# Patient Record
Sex: Female | Born: 1955 | Race: Black or African American | Hispanic: No | Marital: Single | Smoking: Never smoker
Health system: Southern US, Community
[De-identification: ages and names within clinical notes are randomized; demographics above are authoritative.]

## PROBLEM LIST (undated history)

## (undated) DIAGNOSIS — Z83719 Family history of colon polyps, unspecified: Secondary | ICD-10-CM

## (undated) DIAGNOSIS — E559 Vitamin D deficiency, unspecified: Secondary | ICD-10-CM

## (undated) DIAGNOSIS — L259 Unspecified contact dermatitis, unspecified cause: Secondary | ICD-10-CM

## (undated) DIAGNOSIS — J302 Other seasonal allergic rhinitis: Secondary | ICD-10-CM

## (undated) DIAGNOSIS — I6529 Occlusion and stenosis of unspecified carotid artery: Secondary | ICD-10-CM

## (undated) DIAGNOSIS — J449 Chronic obstructive pulmonary disease, unspecified: Secondary | ICD-10-CM

## (undated) DIAGNOSIS — R059 Cough, unspecified: Secondary | ICD-10-CM

## (undated) DIAGNOSIS — R079 Chest pain, unspecified: Secondary | ICD-10-CM

## (undated) DIAGNOSIS — I6522 Occlusion and stenosis of left carotid artery: Secondary | ICD-10-CM

## (undated) DIAGNOSIS — R942 Abnormal results of pulmonary function studies: Secondary | ICD-10-CM

## (undated) DIAGNOSIS — E538 Deficiency of other specified B group vitamins: Secondary | ICD-10-CM

## (undated) DIAGNOSIS — I259 Chronic ischemic heart disease, unspecified: Secondary | ICD-10-CM

## (undated) DIAGNOSIS — K5730 Diverticulosis of large intestine without perforation or abscess without bleeding: Secondary | ICD-10-CM

## (undated) DIAGNOSIS — R319 Hematuria, unspecified: Secondary | ICD-10-CM

## (undated) DIAGNOSIS — R21 Rash and other nonspecific skin eruption: Secondary | ICD-10-CM

## (undated) DIAGNOSIS — L309 Dermatitis, unspecified: Secondary | ICD-10-CM

## (undated) DIAGNOSIS — I340 Nonrheumatic mitral (valve) insufficiency: Secondary | ICD-10-CM

## (undated) DIAGNOSIS — Z6829 Body mass index (BMI) 29.0-29.9, adult: Secondary | ICD-10-CM

## (undated) DIAGNOSIS — I739 Peripheral vascular disease, unspecified: Secondary | ICD-10-CM

## (undated) DIAGNOSIS — R0602 Shortness of breath: Secondary | ICD-10-CM

## (undated) DIAGNOSIS — K649 Unspecified hemorrhoids: Secondary | ICD-10-CM

## (undated) DIAGNOSIS — I700 Atherosclerosis of aorta: Secondary | ICD-10-CM

## (undated) DIAGNOSIS — L739 Follicular disorder, unspecified: Secondary | ICD-10-CM

## (undated) DIAGNOSIS — K635 Polyp of colon: Secondary | ICD-10-CM

## (undated) DIAGNOSIS — I5189 Other ill-defined heart diseases: Secondary | ICD-10-CM

## (undated) DIAGNOSIS — M545 Low back pain, unspecified: Secondary | ICD-10-CM

## (undated) DIAGNOSIS — R9431 Abnormal electrocardiogram [ECG] [EKG]: Secondary | ICD-10-CM

## (undated) DIAGNOSIS — R002 Palpitations: Secondary | ICD-10-CM

## (undated) DIAGNOSIS — K8020 Calculus of gallbladder without cholecystitis without obstruction: Secondary | ICD-10-CM

## (undated) DIAGNOSIS — R5383 Other fatigue: Secondary | ICD-10-CM

## (undated) DIAGNOSIS — M069 Rheumatoid arthritis, unspecified: Secondary | ICD-10-CM

## (undated) DIAGNOSIS — R7303 Prediabetes: Secondary | ICD-10-CM

## (undated) DIAGNOSIS — R238 Other skin changes: Secondary | ICD-10-CM

## (undated) DIAGNOSIS — I2510 Atherosclerotic heart disease of native coronary artery without angina pectoris: Secondary | ICD-10-CM

## (undated) DIAGNOSIS — M1990 Unspecified osteoarthritis, unspecified site: Secondary | ICD-10-CM

## (undated) DIAGNOSIS — I517 Cardiomegaly: Secondary | ICD-10-CM

## (undated) DIAGNOSIS — Z780 Asymptomatic menopausal state: Secondary | ICD-10-CM

## (undated) DIAGNOSIS — I509 Heart failure, unspecified: Secondary | ICD-10-CM

## (undated) DIAGNOSIS — E7800 Pure hypercholesterolemia, unspecified: Secondary | ICD-10-CM

## (undated) HISTORY — DX: Other fatigue: R53.83

## (undated) HISTORY — DX: Polyp of colon: K63.5

## (undated) HISTORY — DX: Nonrheumatic mitral (valve) insufficiency: I34.0

## (undated) HISTORY — DX: Abnormal results of pulmonary function studies: R94.2

## (undated) HISTORY — DX: Unspecified osteoarthritis, unspecified site: M19.90

## (undated) HISTORY — PX: ABDOMINAL HYSTERECTOMY: SHX81

## (undated) HISTORY — DX: Chest pain, unspecified: R07.9

## (undated) HISTORY — DX: Unspecified hemorrhoids: K64.9

## (undated) HISTORY — DX: Low back pain, unspecified: M54.50

## (undated) HISTORY — DX: Other seasonal allergic rhinitis: J30.2

## (undated) HISTORY — DX: Palpitations: R00.2

## (undated) HISTORY — DX: Occlusion and stenosis of left carotid artery: I65.22

## (undated) HISTORY — DX: Prediabetes: R73.03

## (undated) HISTORY — DX: Follicular disorder, unspecified: L73.9

## (undated) HISTORY — DX: Occlusion and stenosis of unspecified carotid artery: I65.29

## (undated) HISTORY — DX: Hematuria, unspecified: R31.9

## (undated) HISTORY — DX: Dermatitis, unspecified: L30.9

## (undated) HISTORY — DX: Peripheral vascular disease, unspecified: I73.9

## (undated) HISTORY — DX: Atherosclerosis of aorta: I70.0

## (undated) HISTORY — DX: Cardiomegaly: I51.7

## (undated) HISTORY — DX: Vitamin D deficiency, unspecified: E55.9

## (undated) HISTORY — DX: Body mass index (BMI) 29.0-29.9, adult: Z68.29

## (undated) HISTORY — DX: Family history of colon polyps, unspecified: Z83.719

## (undated) HISTORY — DX: Heart failure, unspecified: I50.9

## (undated) HISTORY — DX: Cough, unspecified: R05.9

## (undated) HISTORY — DX: Shortness of breath: R06.02

## (undated) HISTORY — DX: Other skin changes: R23.8

## (undated) HISTORY — DX: Rash and other nonspecific skin eruption: R21

## (undated) HISTORY — DX: Asymptomatic menopausal state: Z78.0

## (undated) HISTORY — DX: Pure hypercholesterolemia, unspecified: E78.00

## (undated) HISTORY — DX: Chronic obstructive pulmonary disease, unspecified: J44.9

## (undated) HISTORY — DX: Other ill-defined heart diseases: I51.89

## (undated) HISTORY — DX: Abnormal electrocardiogram (ECG) (EKG): R94.31

## (undated) HISTORY — DX: Calculus of gallbladder without cholecystitis without obstruction: K80.20

## (undated) HISTORY — DX: Chronic ischemic heart disease, unspecified: I25.9

## (undated) HISTORY — DX: Diverticulosis of large intestine without perforation or abscess without bleeding: K57.30

## (undated) HISTORY — DX: Deficiency of other specified B group vitamins: E53.8

## (undated) HISTORY — DX: Unspecified contact dermatitis, unspecified cause: L25.9

## (undated) HISTORY — DX: Atherosclerotic heart disease of native coronary artery without angina pectoris: I25.10

---

## 2010-10-11 ENCOUNTER — Emergency Department (HOSPITAL_BASED_OUTPATIENT_CLINIC_OR_DEPARTMENT_OTHER)
Admission: EM | Admit: 2010-10-11 | Discharge: 2010-10-11 | Payer: Self-pay | Source: Home / Self Care | Admitting: Emergency Medicine

## 2011-11-18 IMAGING — CR DG CHEST 2V
2 series · 2 of 2 positions shown · non-contrast
Comparison: None

CLINICAL DATA: Flu-like symptoms.  Cough.

CHEST - 2 VIEW

[w chest pa]
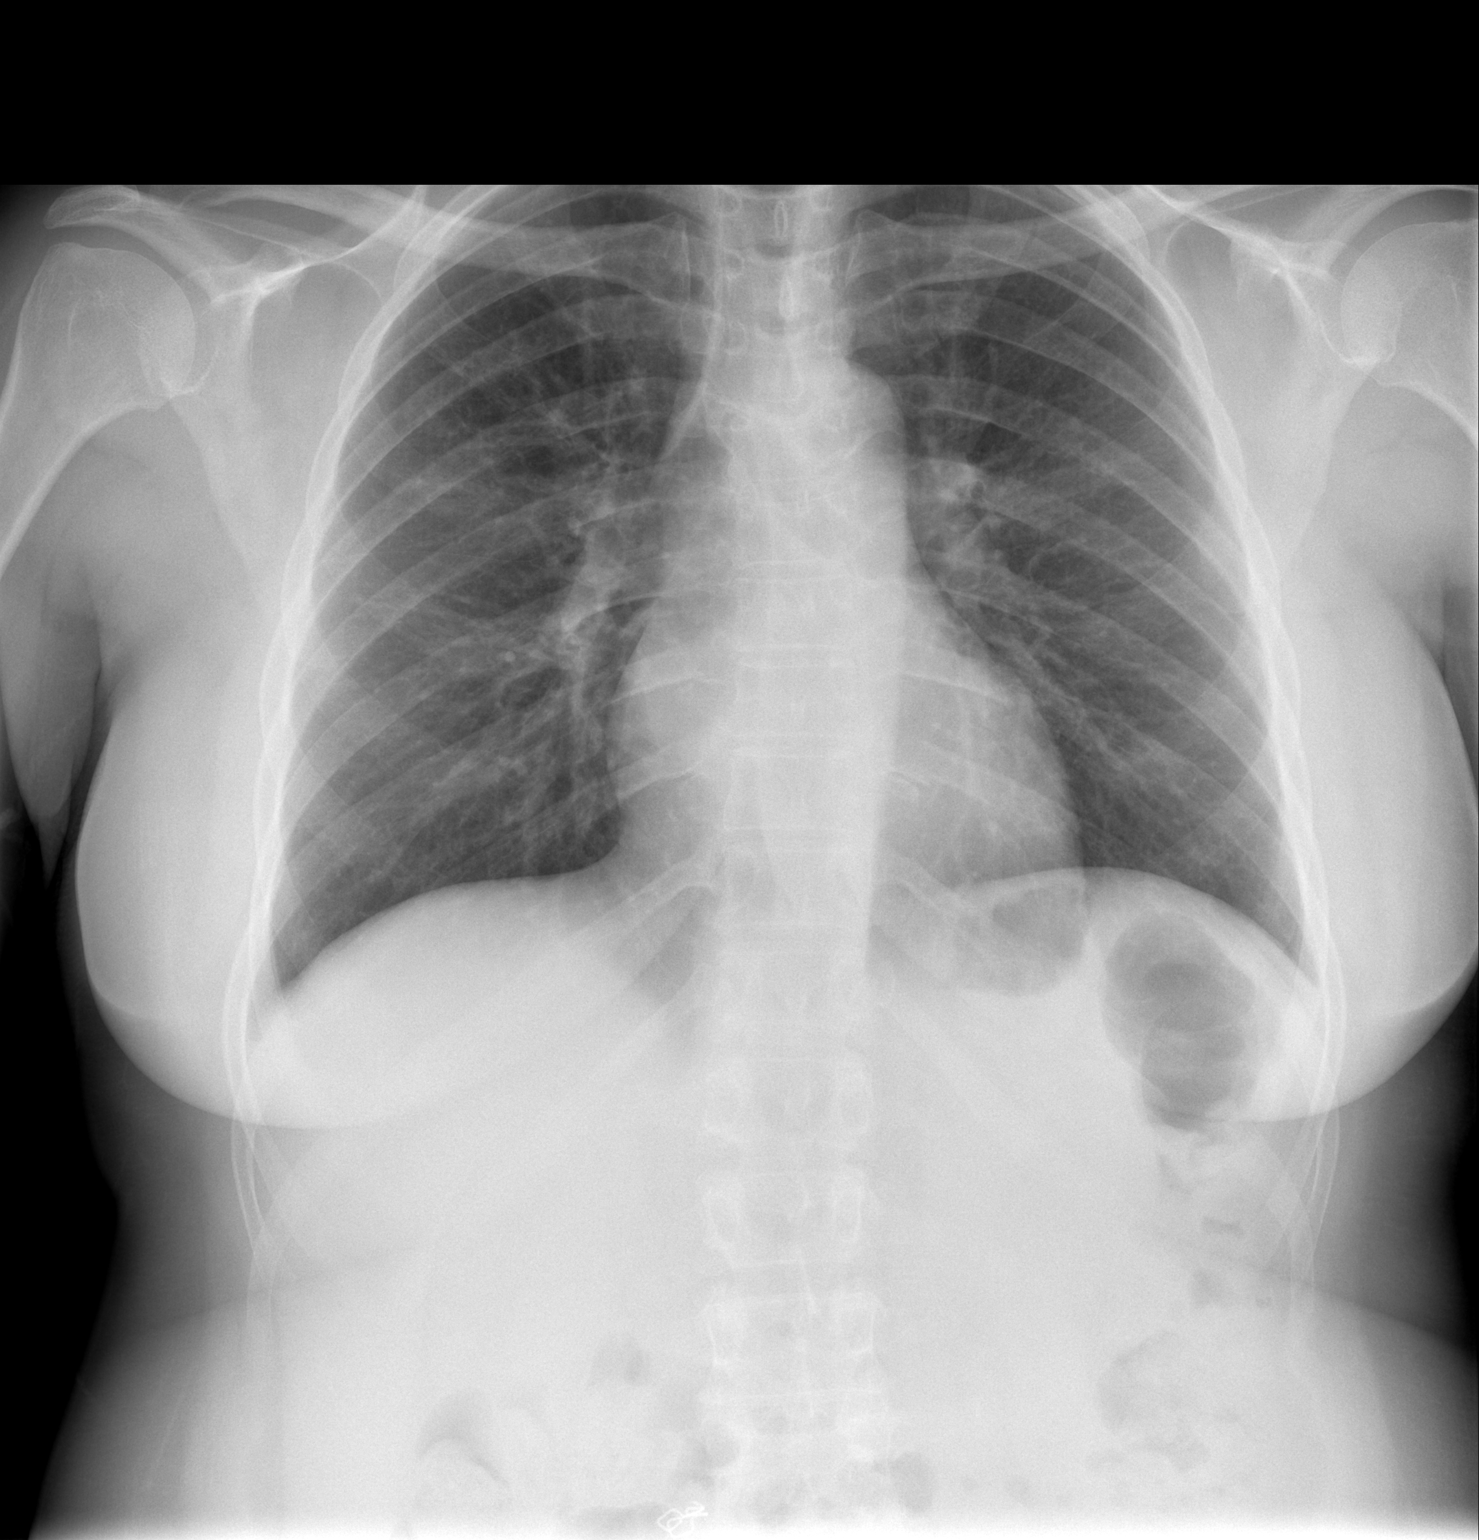

[w chest lat]
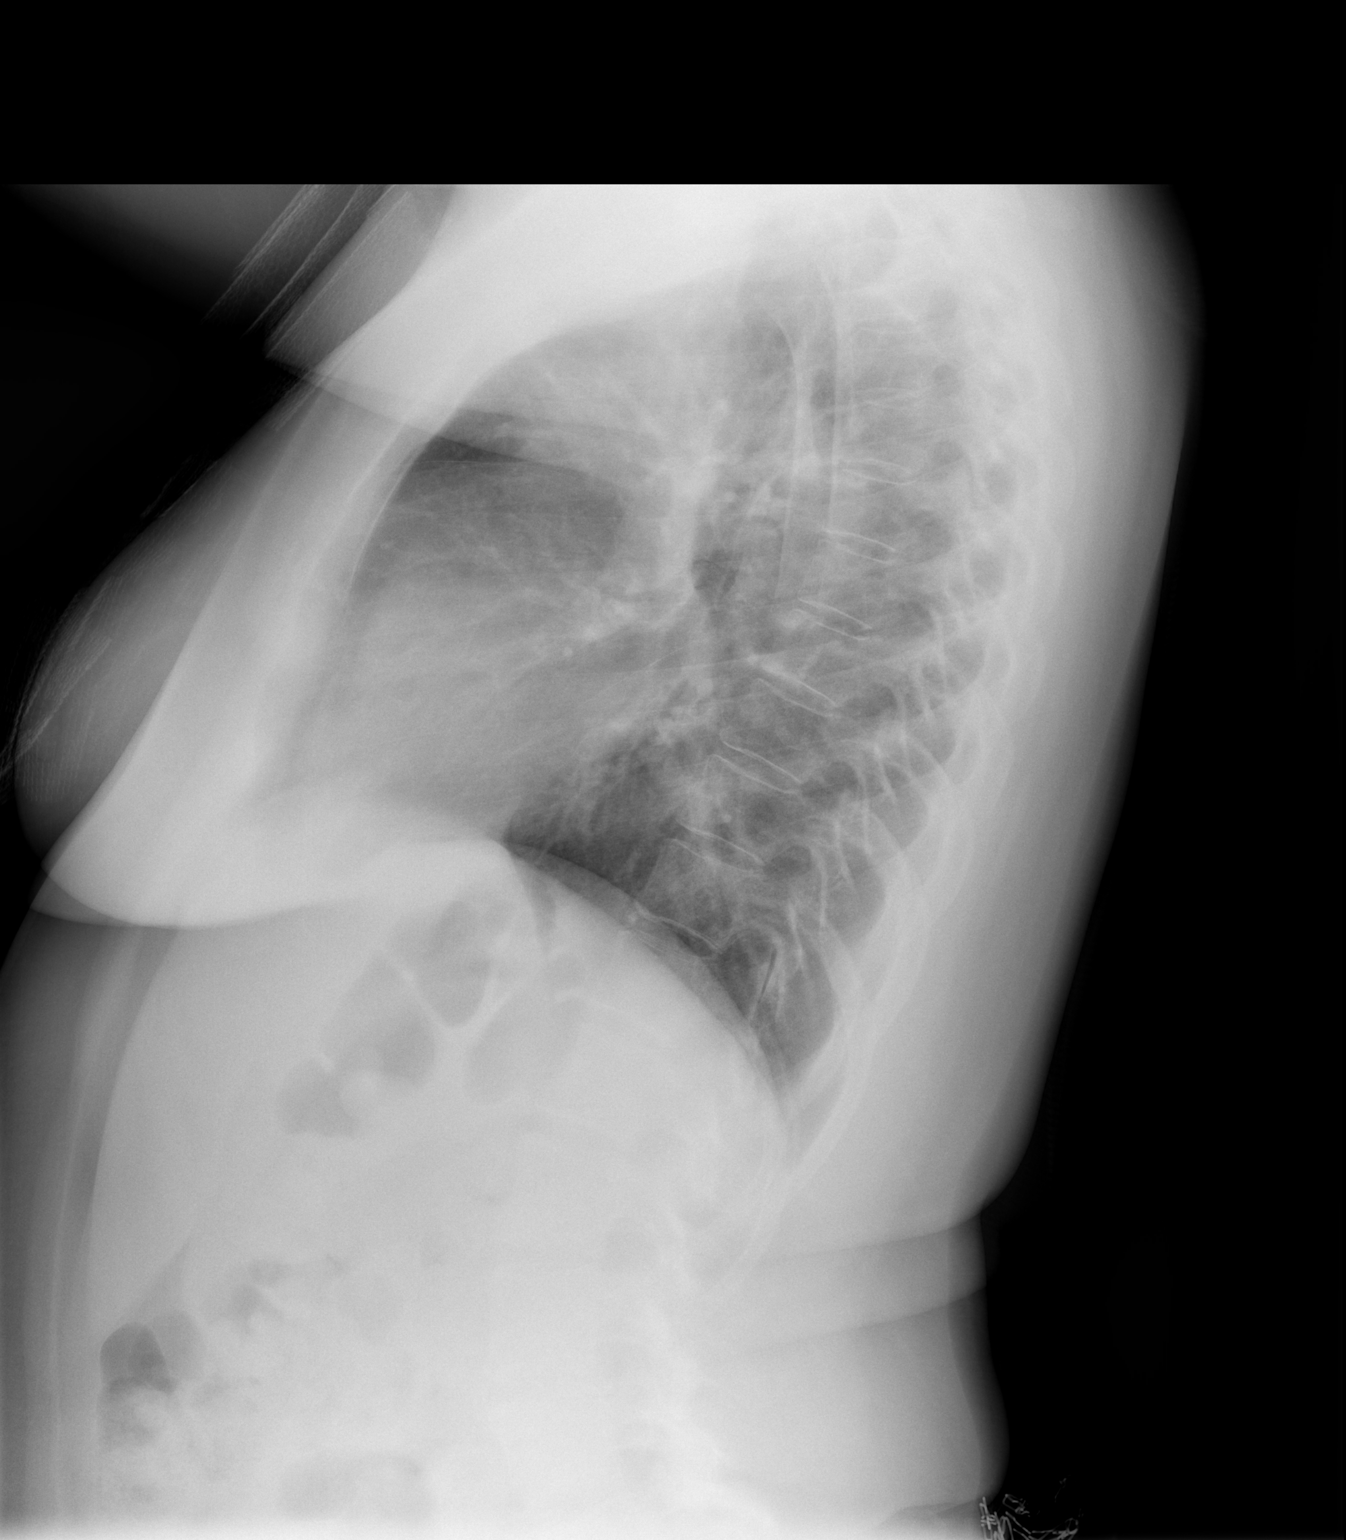

[2 of 2 positions shown; findings below may reference images not displayed]

FINDINGS: The heart size and mediastinal contours are within normal
limits.  Both lungs are clear.  The visualized skeletal structures
are unremarkable.
IMPRESSION: No active cardiopulmonary abnormalities.

## 2015-05-28 ENCOUNTER — Emergency Department (HOSPITAL_BASED_OUTPATIENT_CLINIC_OR_DEPARTMENT_OTHER)
Admission: EM | Admit: 2015-05-28 | Discharge: 2015-05-28 | Disposition: A | Discharge status: Home / Self Care | Payer: 59 | Attending: Emergency Medicine | Admitting: Emergency Medicine

## 2015-05-28 ENCOUNTER — Encounter (HOSPITAL_BASED_OUTPATIENT_CLINIC_OR_DEPARTMENT_OTHER): Payer: Self-pay | Admitting: *Deleted

## 2015-05-28 DIAGNOSIS — R21 Rash and other nonspecific skin eruption: Secondary | ICD-10-CM | POA: Diagnosis present

## 2015-05-28 DIAGNOSIS — L259 Unspecified contact dermatitis, unspecified cause: Secondary | ICD-10-CM | POA: Diagnosis not present

## 2015-05-28 DIAGNOSIS — Z8739 Personal history of other diseases of the musculoskeletal system and connective tissue: Secondary | ICD-10-CM | POA: Insufficient documentation

## 2015-05-28 HISTORY — DX: Rheumatoid arthritis, unspecified: M06.9

## 2015-05-28 MED ORDER — PREDNISONE 20 MG PO TABS: ORAL_TABLET | ORAL | Status: DC

## 2015-05-28 MED ORDER — HYDROXYZINE HCL 25 MG PO TABS: 25.0000 mg | ORAL_TABLET | Freq: Four times a day (QID) | ORAL | Status: DC

## 2015-05-28 NOTE — ED Notes (Signed)
Pt reports having her hair colored last week, pt concerned she may be having an allergic reaction to the hair dye as she has been experiencing scalp pruritis and rash behind ears and hairline - pt states she has attempted to use calamine lotion at home w/o relief.

## 2015-05-28 NOTE — Discharge Instructions (Signed)

## 2015-05-28 NOTE — ED Provider Notes (Signed)
CSN: [id number]   Arrival date & time 05/28/15 2139  History  This chart was scribed for Rolan Bucco, MD by Bethel Born, ED Scribe. This patient was seen in room MH01/MH01 and the patient's care was started at 10:01 PM.  Chief Complaint  Patient presents with  . Pruritis  . Allergic Reaction    HPI The history is provided by the patient. No language interpreter was used.   Cathy Estes is a 59 y.o. female who presents to the Emergency Department complaining of an allergic reaction to hair dye. Pt had her hair colored "almost a month ago" and now has itching at the scalp and a pruritic rash near the ears with onset 5 days ago. She has had a similar reaction with dye in the past and denies using any other new products. Calamine lotion provided insufficient relief in itching PTA. Pt denies SOB and angioedema.   Past Medical History  Diagnosis Date  . Rheumatoid arthritis     Past Surgical History  Procedure Laterality Date  . Abdominal hysterectomy      History reviewed. No pertinent family history.  Social History  Substance Use Topics  . Smoking status: Never Smoker   . Smokeless tobacco: None  . Alcohol Use: No     Review of Systems  Constitutional: Negative for fever, chills, diaphoresis and fatigue.  HENT: Negative for congestion, rhinorrhea and sneezing.   Eyes: Negative.   Respiratory: Negative for cough, chest tightness and shortness of breath.   Cardiovascular: Negative for chest pain and leg swelling.  Gastrointestinal: Negative for nausea, vomiting, abdominal pain, diarrhea and blood in stool.  Genitourinary: Negative for frequency, hematuria, flank pain and difficulty urinating.  Musculoskeletal: Negative for back pain and arthralgias.  Skin: Positive for rash.  Neurological: Negative for dizziness, speech difficulty, weakness, numbness and headaches.     Home Medications   Prior to Admission medications   Medication Sig Start Date End Date Taking?  Authorizing Provider  hydrOXYzine (ATARAX/VISTARIL) 25 MG tablet Take 1 tablet (25 mg total) by mouth every 6 (six) hours. 05/28/15   Rolan Bucco, MD  predniSONE (DELTASONE) 20 MG tablet 3 tabs po day one, then 2 po daily x 4 days 05/28/15   Rolan Bucco, MD    Allergies  Sulfa antibiotics  Triage Vitals: BP 126/80 mmHg  Pulse 72  Temp(Src) 98.2 F (36.8 C) (Oral)  Resp 14  Ht 5\' 1"  (1.549 m)  Wt 159 lb (72.122 kg)  BMI 30.06 kg/m2  SpO2 98%  Physical Exam  Constitutional: She is oriented to person, place, and time. She appears well-developed and well-nourished.  HENT:  Head: Normocephalic and atraumatic.  No facial swelling or angioedema  Eyes: Pupils are equal, round, and reactive to light.  Neck: Normal range of motion. Neck supple.  Cardiovascular: Normal rate, regular rhythm and normal heart sounds.   Pulmonary/Chest: Effort normal and breath sounds normal. No respiratory distress. She has no wheezes. She has no rales. She exhibits no tenderness.  Abdominal: Soft. Bowel sounds are normal. There is no tenderness. There is no rebound and no guarding.  Musculoskeletal: Normal range of motion. She exhibits no edema.  Lymphadenopathy:    She has no cervical adenopathy.  Neurological: She is alert and oriented to person, place, and time.  Skin: Skin is warm and dry. Rash noted.  Patient has a slightly raised erythematous rash along the hairline and the face. It is blanching, no petechiae or purpura, no vesicles.  Psychiatric: She has  a normal mood and affect.     ED Course  Procedures   DIAGNOSTIC STUDIES: Oxygen Saturation is 96% on RA, normal by my interpretation.    COORDINATION OF CARE: 10:05 PM Discussed treatment plan with pt at bedside and pt agreed to plan.  No results found for this or any previous visit. No results found.  Imaging Review No results found.  EKG Interpretation None      MDM   Final diagnoses:  Contact dermatitis   Patient was  started on a five-day course of prednisone and given a prescription for Atarax for itching. Return precautions were given.  I personally performed the services described in this documentation, which was scribed in my presence.  The recorded information has been reviewed and considered.    Rolan Bucco, MD 05/28/15 [phone]

## 2020-03-13 ENCOUNTER — Other Ambulatory Visit: Payer: Self-pay

## 2020-03-13 ENCOUNTER — Emergency Department (HOSPITAL_BASED_OUTPATIENT_CLINIC_OR_DEPARTMENT_OTHER)
Admission: EM | Admit: 2020-03-13 | Discharge: 2020-03-13 | Disposition: A | Discharge status: Home / Self Care | Payer: Managed Care, Other (non HMO) | Attending: Emergency Medicine | Admitting: Emergency Medicine

## 2020-03-13 ENCOUNTER — Encounter (HOSPITAL_BASED_OUTPATIENT_CLINIC_OR_DEPARTMENT_OTHER): Payer: Self-pay | Admitting: Emergency Medicine

## 2020-03-13 ENCOUNTER — Emergency Department (HOSPITAL_BASED_OUTPATIENT_CLINIC_OR_DEPARTMENT_OTHER): Payer: Managed Care, Other (non HMO)

## 2020-03-13 DIAGNOSIS — M25562 Pain in left knee: Secondary | ICD-10-CM | POA: Diagnosis present

## 2020-03-13 DIAGNOSIS — M069 Rheumatoid arthritis, unspecified: Secondary | ICD-10-CM | POA: Insufficient documentation

## 2020-03-13 DIAGNOSIS — Z79899 Other long term (current) drug therapy: Secondary | ICD-10-CM | POA: Diagnosis not present

## 2020-03-13 MED ORDER — DICLOFENAC SODIUM 1 % EX GEL: 2.0000 g | Freq: Four times a day (QID) | CUTANEOUS | 0 refills | Status: DC

## 2020-03-13 NOTE — ED Provider Notes (Signed)
[facility] EMERGENCY DEPARTMENT Provider Note   CSN: [id number] Arrival date & time: 03/13/20  1709     History Chief Complaint  Patient presents with  . Knee Pain    Cathy Estes is a 64 y.o. female.  Patient is a 64 year old female who presents with left knee pain.  She said she was at work about 1 month ago and had her foot planted and twisted the wrong way.  She has had some pain and swelling to her knee since that time.  She denies any other injuries.  No prior injuries to the knee.  No numbness or weakness to the leg.  She has been using Aleve with some improvement in symptoms.  Hurts to walk on it but she is able to walk without significant difficulty.        Past Medical History:  Diagnosis Date  . Rheumatoid arthritis ([place])     There are no problems to display for this patient.   Past Surgical History:  Procedure Laterality Date  . ABDOMINAL HYSTERECTOMY       OB History   No obstetric history on file.     No family history on file.  Social History   Tobacco Use  . Smoking status: Never Smoker  . Smokeless tobacco: Never Used  Substance Use Topics  . Alcohol use: No  . Drug use: No    Home Medications Prior to Admission medications   Medication Sig Start Date End Date Taking? Authorizing Provider  diclofenac Sodium (VOLTAREN) 1 % GEL Apply 2 g topically 4 (four) times daily. 03/13/20   Malvin Johns, MD  hydrOXYzine (ATARAX/VISTARIL) 25 MG tablet Take 1 tablet (25 mg total) by mouth every 6 (six) hours. 05/28/15   Malvin Johns, MD  predniSONE (DELTASONE) 20 MG tablet 3 tabs po day one, then 2 po daily x 4 days 05/28/15   Malvin Johns, MD    Allergies    Sulfa antibiotics  Review of Systems   Review of Systems  Constitutional: Negative for fever.  Gastrointestinal: Negative for nausea and vomiting.  Musculoskeletal: Positive for arthralgias and joint swelling. Negative for back pain and neck pain.  Skin: Negative for wound.    Neurological: Negative for weakness, numbness and headaches.    Physical Exam Updated Vital Signs BP 123/79 (BP Location: Right Arm)   Pulse 75   Temp 98 F (36.7 C) Comment: Rounded on Pt explained the Delay   Resp 18   Ht 5\' 1"  (1.549 m)   Wt 72.1 kg   SpO2 100%   BMI 30.04 kg/m   Physical Exam Constitutional:      Appearance: She is well-developed.  HENT:     Head: Normocephalic and atraumatic.  Cardiovascular:     Rate and Rhythm: Normal rate.  Pulmonary:     Effort: Pulmonary effort is normal.  Musculoskeletal:        General: Tenderness present.     Cervical back: Normal range of motion and neck supple.     Comments: Patient has tenderness to the medial aspect of the knee and the proximal tibia on the right side.  There is some mild swelling and small effusion noted.  She is able to do a straight leg raise.  Patellar and quadriceps tendon appears to be intact.  There is no ligament laxity on exam.  No pain to the hip or ankle.  She is neurovascularly intact distally.  Pulses are intact in the foot.  No warmth or  erythema noted.  No overlying wounds or rashes.  Skin:    General: Skin is warm and dry.  Neurological:     Mental Status: She is alert and oriented to person, place, and time.     ED Results / Procedures / Treatments   Labs (all labs ordered are listed, but only abnormal results are displayed) Labs Reviewed - No data to display  EKG None  Radiology DG Knee Complete 4 Views Left  Result Date: 03/13/2020 CLINICAL DATA:  Left knee pain status post trauma 1 month ago. EXAM: LEFT KNEE - COMPLETE 4+ VIEW COMPARISON:  None. FINDINGS: No evidence of fracture, dislocation, or joint effusion. No evidence of arthropathy or other focal bone abnormality. Soft tissues are unremarkable. IMPRESSION: Negative. Electronically Signed   By: Aram Candela M.D.   On: 03/13/2020 17:49    Procedures Procedures (including critical care time)  Medications Ordered in  ED Medications - No data to display  ED Course  I have reviewed the triage vital signs and the nursing notes.  Pertinent labs & imaging results that were available during my care of the patient were reviewed by me and considered in my medical decision making (see chart for details).    MDM Rules/Calculators/A&P                      Patient presents with pain in her left knee.  I do not feel any obvious ligament laxity.  There is no bony injury noted on x-ray.  She was advised in symptomatic care.  She was given a prescription for Voltaren cream.  She was advised on icing.  She was given a referral to follow-up with Dr. Jordan Likes with sports medicine. Final Clinical Impression(s) / ED Diagnoses Final diagnoses:  Acute pain of left knee    Rx / DC Orders ED Discharge Orders         Ordered    diclofenac Sodium (VOLTAREN) 1 % GEL  4 times daily     03/13/20 2043           Rolan Bucco, MD 03/13/20 2047

## 2020-03-13 NOTE — ED Triage Notes (Signed)
L knee pain and swelling x 1 month. She states she turned wrong at work.

## 2021-02-25 ENCOUNTER — Encounter: Payer: Self-pay | Admitting: Gastroenterology

## 2021-05-23 ENCOUNTER — Encounter: Payer: Managed Care, Other (non HMO) | Admitting: Gastroenterology

## 2022-08-14 NOTE — Progress Notes (Unsigned)
NEW PATIENT Date of Service/Encounter:  08/15/22 Referring provider: Windell Hummingbird, PA-C Primary care provider: Windell Hummingbird, PA-C  Subjective:  Cathy Estes is a 65 y.o. female with a PMHx of rheumatoid arthritis presenting today for evaluation of dermatitis. History obtained from: chart review and patient.   Rash: October 28th put on hair dye and started having itching and bumps on her skull, starting putting on sunscreen which didn't help She then started using topical hydrocortisone and antihistamines She did see a dermatologist over the summer due to breaking out in a rash on her face and was given hydrocortisone and sunblock.   She has been using the same hair dye for years and has never had this issue-semi-perm named Cathy Estes. Caused her neck to get bumps and scalp soreness.  She is still symptomatic.  Prior to this, she will randomly get red itchy bumps that occur on her elbows and last a few days before resolving. She will treat with vaseline to keep her skin cool and this helps with itching.  Her eyes sometimes burn and itch, but not watery.  She will hydrate herself and this sometimes helps with eye symptoms.  She is not sure if antihistamines help with itching.   Sulfa-causes a rash  Other allergy screening: Food allergy: no Asthma: no Medication allergy: no Hymenoptera allergy: no Eczema:no   Past Medical History: Past Medical History:  Diagnosis Date   Rheumatoid arthritis ([place])    Medication List:  Current Outpatient Medications  Medication Sig Dispense Refill   diclofenac Sodium (VOLTAREN) 1 % GEL Apply 2 g topically 4 (four) times daily. 2 g 0   Fluocinolone Acetonide Body (DERMA-SMOOTHE/FS BODY) 0.01 % OIL Apply 1 Application topically 2 (two) times daily as needed. 118 mL 3   hydrOXYzine (ATARAX/VISTARIL) 25 MG tablet Take 1 tablet (25 mg total) by mouth every 6 (six) hours. 12 tablet 0   predniSONE (DELTASONE) 20 MG tablet 3 tabs po day one, then  2 po daily x 4 days 11 tablet 0   triamcinolone ointment (KENALOG) 0.1 % Apply topically twice daily to BODY as needed for red, sandpaper like rash.  Do not use on face, groin or armpits. 80 g 1   No current facility-administered medications for this visit.   Known Allergies:  Allergies  Allergen Reactions   Sulfa Antibiotics Rash   Past Surgical History: Past Surgical History:  Procedure Laterality Date   ABDOMINAL HYSTERECTOMY     Family History: History reviewed. No pertinent family history. Social History: Cathy Estes lives in an apartment built 4 years ago, no water damage, carpet in bedroom, electric heating, central AC, no pets, no cockroaches, not using dust mite protection on the bedding or pillows, no smoke exposure.  She works in [employer] for 11 years, + HEPA filter in the home.  [place]   ROS:  All other systems negative except as noted per HPI.  Objective:  Blood pressure 112/76, pulse 76, temperature 98.4 F (36.9 C), temperature source Temporal, resp. rate 18, height 5\' 1"  (1.549 m), weight 153 lb 6.4 oz (69.6 kg), SpO2 100 %. Body mass index is 28.98 kg/m. Physical Exam:  General Appearance:  Alert, cooperative, no distress, appears stated age  Head:  Normocephalic, without obvious abnormality, atraumatic  Eyes:  Conjunctiva clear, EOM's intact  Nose: Nares normal, hypertrophic turbinates, normal mucosa, no visible anterior polyps, and septum midline  Throat: Lips, tongue normal; teeth and gums normal, normal posterior oropharynx  Neck:  Supple, symmetrical  Lungs:   clear to auscultation bilaterally, Respirations unlabored, no coughing  Heart:  regular rate and rhythm and no murmur, Appears well perfused  Extremities: No edema  Skin: Skin color, texture, turgor normal, excoriation on scalp without obvious rash, hyperpigmented macule behind ear  Neurologic: No gross deficits     Diagnostics: Skin Testing:  Environmental allergy panel and select foods.  Adequate controls. Results discussed with patient/family.  Airborne Adult Perc - 08/15/22 1037     Time Antigen Placed 1037    Allergen Manufacturer Waynette Buttery    Location Back    Number of Test 59    1. Control-Buffer 50% Glycerol Negative    2. Control-Histamine 1 mg/ml 3+    3. Albumin saline Negative    4. Bahia Negative    5. French Southern Territories Negative    6. Johnson Negative    7. Kentucky Blue Negative    8. Meadow Fescue Negative    9. Perennial Rye Negative    10. Sweet Vernal Negative    11. Timothy Negative    12. Cocklebur Negative    13. Burweed Marshelder Negative    14. Ragweed, short Negative    15. Ragweed, Giant Negative    16. Plantain,  English Negative    17. Lamb's Quarters Negative    18. Sheep Sorrell Negative    19. Rough Pigweed Negative    20. Marsh Elder, Rough Negative    21. Mugwort, Common Negative    22. Ash mix Negative    23. Birch mix Negative    24. Beech American Negative    25. Box, Elder Negative    26. Cedar, red Negative    27. Cottonwood, Guinea-Bissau Negative    28. Elm mix Negative    29. Hickory Negative    30. Maple mix Negative    31. Oak, Guinea-Bissau mix Negative    32. Pecan Pollen Negative    33. Pine mix Negative    34. Sycamore Eastern Negative    35. Walnut, Black Pollen Negative    36. Alternaria alternata Negative    37. Cladosporium Herbarum Negative    38. Aspergillus mix Negative    39. Penicillium mix Negative    40. Bipolaris sorokiniana (Helminthosporium) Negative    41. Drechslera spicifera (Curvularia) Negative    42. Mucor plumbeus Negative    43. Fusarium moniliforme Negative    44. Aureobasidium pullulans (pullulara) Negative    45. Rhizopus oryzae Negative    46. Botrytis cinera Negative    47. Epicoccum nigrum Negative    48. Phoma betae Negative    49. Candida Albicans Negative    50. Trichophyton mentagrophytes Negative    51. Mite, D Farinae  5,000 AU/ml Negative     52. Mite, D Pteronyssinus  5,000 AU/ml Negative    53. Cat Hair 10,000 BAU/ml Negative    54.  Dog Epithelia Negative    55. Mixed Feathers Negative    56. Horse Epithelia Negative    57. Cockroach, German Negative    58. Mouse Negative    59. Tobacco Leaf Negative             Food Perc - 08/15/22 1036       Test Information   Time Antigen Placed 1036    Allergen Manufacturer Waynette Buttery    Location Back    Number of allergen test 10      Food   1. Peanut Negative    2. Soybean  food Negative    3. Wheat, whole Negative    4. Sesame Negative    5. Milk, cow Negative    6. Egg White, chicken Negative    7. Casein Negative    8. Shellfish mix Negative    9. Fish mix Negative    10. Cashew Negative             Intradermal - 08/15/22 1036     Time Antigen Placed 1036    Allergen Manufacturer Other    Location Arm    Number of Test 15    Control Negative    French Southern Territories Negative    Johnson Negative    7 Grass Negative    Ragweed mix Negative    Weed mix Negative    Tree mix Negative    Mold 1 Negative    Mold 2 Negative    Mold 3 Negative    Mold 4 Negative    Cat Negative    Dog Negative    Cockroach Negative    Mite mix Negative            Allergy testing results were read and interpreted by myself, documented by clinical staff.  Assessment and Plan  Scalp Dermatitis-suspect secondary to contact dermatitis - environmental and food allergy testing to most common food allergens negative today - return for patch testing  -Monday, Wednesday, Friday (must not receive any steroid injections or increased prendisone doses for 4 weeks prior to testing)  - can use dermasoothe up to twice daily on scalp as needed for rash if returns - for elbows, use triamcinolone 0.01% up to twice daily as needed for rash if returns until rash resolves - for itching or runny nose/congestion, use Zyrtec 10 mg or Xyzal 5 mg daily as needed.  -for watery/itchy eyes-consider pataday or  zaditor over the counter eye drops as needed  Avoid sulfas.  Follow up : for patch testing as scheduled  This note in its entirety was forwarded to the Provider who requested this consultation.  Thank you for your kind referral. I appreciate the opportunity to take part in Susanne's care. Please do not hesitate to contact me with questions.  Sincerely,  Tonny Bollman, MD [facility]

## 2022-08-15 ENCOUNTER — Encounter: Payer: Self-pay | Admitting: Internal Medicine

## 2022-08-15 ENCOUNTER — Ambulatory Visit (INDEPENDENT_AMBULATORY_CARE_PROVIDER_SITE_OTHER): Payer: Commercial Managed Care - HMO | Admitting: Internal Medicine

## 2022-08-15 VITALS — BP 112/76 | HR 76 | Temp 98.4°F | Resp 18 | Ht 61.0 in | Wt 153.4 lb

## 2022-08-15 DIAGNOSIS — J310 Chronic rhinitis: Secondary | ICD-10-CM

## 2022-08-15 DIAGNOSIS — L2082 Flexural eczema: Secondary | ICD-10-CM

## 2022-08-15 DIAGNOSIS — R21 Rash and other nonspecific skin eruption: Secondary | ICD-10-CM | POA: Diagnosis not present

## 2022-08-15 DIAGNOSIS — Z882 Allergy status to sulfonamides status: Secondary | ICD-10-CM

## 2022-08-15 DIAGNOSIS — L235 Allergic contact dermatitis due to other chemical products: Secondary | ICD-10-CM | POA: Diagnosis not present

## 2022-08-15 MED ORDER — TRIAMCINOLONE ACETONIDE 0.1 % EX OINT: TOPICAL_OINTMENT | CUTANEOUS | 1 refills | Status: DC

## 2022-08-15 MED ORDER — FLUOCINOLONE ACETONIDE BODY 0.01 % EX OIL: 1.0000 | TOPICAL_OIL | Freq: Two times a day (BID) | CUTANEOUS | 3 refills | Status: DC | PRN

## 2022-08-15 NOTE — Patient Instructions (Addendum)
Scalp Dermatitis-suspect secondary to contact dermatitis - environmental and food allergy testing to most common food allergens negative today - return for patch testing  -Monday, Wednesday, Friday (must not receive any steroid injections or increased prendisone doses for 4 weeks prior to testing)  - can use dermasoothe up to twice daily on scalp as needed for rash if returns - for elbows, use triamcinolone 0.01% up to twice daily as needed for rash if returns until rash resolves - for itching or runny nose/congestion, use Zyrtec 10 mg or Xyzal 5 mg daily as needed.  -for watery/itchy eyes-consider pataday or zaditor over the counter eye drops as needed  Avoid sulfas.  Follow up : for patch testing as scheduled It was a pleasure meeting you in clinic today! Thank you for allowing me to participate in your care.  Sigurd Sos, MD [facility]

## 2022-09-11 ENCOUNTER — Ambulatory Visit: Payer: Commercial Managed Care - HMO | Admitting: Internal Medicine

## 2022-09-13 ENCOUNTER — Encounter: Payer: Commercial Managed Care - HMO | Admitting: Internal Medicine

## 2022-09-15 ENCOUNTER — Encounter: Payer: Commercial Managed Care - HMO | Admitting: Family

## 2023-02-01 ENCOUNTER — Emergency Department (HOSPITAL_BASED_OUTPATIENT_CLINIC_OR_DEPARTMENT_OTHER)
Admission: EM | Admit: 2023-02-01 | Discharge: 2023-02-01 | Disposition: A | Discharge status: Home / Self Care | Payer: Medicaid Other | Attending: Emergency Medicine | Admitting: Emergency Medicine

## 2023-02-01 ENCOUNTER — Encounter (HOSPITAL_BASED_OUTPATIENT_CLINIC_OR_DEPARTMENT_OTHER): Payer: Self-pay

## 2023-02-01 ENCOUNTER — Other Ambulatory Visit: Payer: Self-pay

## 2023-02-01 DIAGNOSIS — R21 Rash and other nonspecific skin eruption: Secondary | ICD-10-CM | POA: Diagnosis present

## 2023-02-01 DIAGNOSIS — L234 Allergic contact dermatitis due to dyes: Secondary | ICD-10-CM | POA: Insufficient documentation

## 2023-02-01 MED ORDER — METHYLPREDNISOLONE 4 MG PO TBPK: ORAL_TABLET | ORAL | 0 refills | Status: DC

## 2023-02-01 MED ORDER — HYDROXYZINE HCL 25 MG PO TABS: 25.0000 mg | ORAL_TABLET | Freq: Four times a day (QID) | ORAL | 0 refills | Status: DC | PRN

## 2023-02-01 NOTE — ED Notes (Signed)
Pt has swollen areas and tiny bumps on face, hairline and on neck.  Pt does c/o itching

## 2023-02-01 NOTE — ED Provider Notes (Signed)
[facility] Provider Note: Lowella Dell, MD, FACEP  CSN: [id number] MRN: [id number] ARRIVAL: 02/01/23 at 0002 ROOM: MH09/MH09   CHIEF COMPLAINT  Allergic Reaction   HISTORY OF PRESENT ILLNESS  02/01/23 1:20 AM Cathy Estes is a 67 y.o. female who dyed her hair 3 days ago (it was a henna based dye).  Now she has a pruritic rash of her scalp.  She thinks the dye may have triggered an allergic reaction.  Symptoms are moderate.   Past Medical History:  Diagnosis Date   Rheumatoid arthritis     Past Surgical History:  Procedure Laterality Date   ABDOMINAL HYSTERECTOMY      History reviewed. No pertinent family history.  Social History   Tobacco Use   Smoking status: Never   Smokeless tobacco: Never  Vaping Use   Vaping Use: Never used  Substance Use Topics   Alcohol use: No   Drug use: No    Prior to Admission medications   Medication Sig Start Date End Date Taking? Authorizing Provider  diclofenac Sodium (VOLTAREN) 1 % GEL Apply 2 g topically 4 (four) times daily. 03/13/20   Rolan Bucco, MD  Fluocinolone Acetonide Body (DERMA-SMOOTHE/FS BODY) 0.01 % OIL Apply 1 Application topically 2 (two) times daily as needed. 08/15/22   Verlee Monte, MD  hydrOXYzine (ATARAX) 25 MG tablet Take 1-2 tablets (25-50 mg total) by mouth every 6 (six) hours as needed for itching (May cause drowsiness). 02/01/23  Yes Zaiah Credeur, MD  methylPREDNISolone (MEDROL DOSEPAK) 4 MG TBPK tablet Take tapering dose per package instructions. 02/01/23  Yes Nasif Bos, MD  triamcinolone ointment (KENALOG) 0.1 % Apply topically twice daily to BODY as needed for red, sandpaper like rash.  Do not use on face, groin or armpits. 08/15/22   Verlee Monte, MD    Allergies Sulfa antibiotics   REVIEW OF SYSTEMS  Negative except as noted here or in the History of Present Illness.   PHYSICAL EXAMINATION  Initial Vital Signs Blood pressure 129/79, pulse 72, temperature 98.3 F (36.8 C),  temperature source Oral, resp. rate 18, height  (1.549 m), weight 70.3 kg, SpO2 100 %.  Examination General: Well-developed, well-nourished female in no acute distress; appearance consistent with age of record HENT: normocephalic; atraumatic; scalp dermatitis Eyes: Normal appearance Neck: supple Heart: regular rate and rhythm Lungs: clear to auscultation bilaterally Abdomen: soft; nondistended; nontender; bowel sounds present Extremities: No deformity; full range of motion Neurologic: Awake, alert and oriented; motor function intact in all extremities and symmetric; no facial droop Skin: Warm and dry Psychiatric: Normal mood and affect   RESULTS  Summary of this visit's results, reviewed and interpreted by myself:   EKG Interpretation  Date/Time:    Ventricular Rate:    PR Interval:    QRS Duration:   QT Interval:    QTC Calculation:   R Axis:     Text Interpretation:         Laboratory Studies: No results found for this or any previous visit (from the past 24 hour(s)). Imaging Studies: No results found.  ED COURSE and MDM  Nursing notes, initial and subsequent vitals signs, including pulse oximetry, reviewed and interpreted by myself.  Vitals:   02/01/23 0010 02/01/23 0011  BP:  129/79  Pulse:  72  Resp:  18  Temp:  98.3 F (36.8 C)  TempSrc:  Oral  SpO2:  100%  Weight: 70.3 kg   Height:  (1.549 m)  Medications - No data to display  The patient would prefer a systemic steroid over a topical steroid.  She is already on low-dose prednisone (5 mg daily) for rheumatoid arthritis.  PROCEDURES  Procedures   ED DIAGNOSES     ICD-10-CM   1. 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

## 2023-02-01 NOTE — ED Triage Notes (Signed)
Pt c/o possible allergic reaction to hair color. Pt reports she colored her hair on Monday, now she has rash on bumps and head that itch.

## 2023-04-20 ENCOUNTER — Encounter (HOSPITAL_BASED_OUTPATIENT_CLINIC_OR_DEPARTMENT_OTHER): Payer: Self-pay

## 2023-04-20 ENCOUNTER — Emergency Department (HOSPITAL_BASED_OUTPATIENT_CLINIC_OR_DEPARTMENT_OTHER)
Admission: EM | Admit: 2023-04-20 | Discharge: 2023-04-20 | Disposition: A | Discharge status: Home / Self Care | Payer: Medicare PPO | Attending: Emergency Medicine | Admitting: Emergency Medicine

## 2023-04-20 ENCOUNTER — Emergency Department (HOSPITAL_BASED_OUTPATIENT_CLINIC_OR_DEPARTMENT_OTHER): Payer: Medicare PPO

## 2023-04-20 ENCOUNTER — Other Ambulatory Visit: Payer: Self-pay

## 2023-04-20 DIAGNOSIS — R1032 Left lower quadrant pain: Secondary | ICD-10-CM | POA: Insufficient documentation

## 2023-04-20 DIAGNOSIS — R051 Acute cough: Secondary | ICD-10-CM | POA: Insufficient documentation

## 2023-04-20 DIAGNOSIS — Z1152 Encounter for screening for COVID-19: Secondary | ICD-10-CM | POA: Insufficient documentation

## 2023-04-20 LAB — COMPREHENSIVE METABOLIC PANEL
ALT: 14 U/L (ref 0–44)
AST: 21 U/L (ref 15–41)
Albumin: 3.4 g/dL — ABNORMAL LOW (ref 3.5–5.0)
Alkaline Phosphatase: 43 U/L (ref 38–126)
Anion gap: 6 (ref 5–15)
BUN: 13 mg/dL (ref 8–23)
CO2: 27 mmol/L (ref 22–32)
Calcium: 9.4 mg/dL (ref 8.9–10.3)
Chloride: 103 mmol/L (ref 98–111)
Creatinine, Ser: 0.8 mg/dL (ref 0.44–1.00)
GFR, Estimated: >60 mL/min (ref >60)
Glucose, Bld: 93 mg/dL (ref 70–99)
Potassium: 3.8 mmol/L (ref 3.5–5.1)
Sodium: 136 mmol/L (ref 135–145)
Total Bilirubin: 0.9 mg/dL (ref 0.3–1.2)
Total Protein: 8.9 g/dL — ABNORMAL HIGH (ref 6.5–8.1)

## 2023-04-20 LAB — URINALYSIS, ROUTINE W REFLEX MICROSCOPIC
Bilirubin Urine: NEGATIVE
Glucose, UA: NEGATIVE mg/dL
Hgb urine dipstick: NEGATIVE
Ketones, ur: NEGATIVE mg/dL
Nitrite: NEGATIVE
Protein, ur: NEGATIVE mg/dL
Specific Gravity, Urine: 1.025 (ref 1.005–1.030)
pH: 5.5 (ref 5.0–8.0)

## 2023-04-20 LAB — URINALYSIS, MICROSCOPIC (REFLEX): RBC / HPF: NONE SEEN RBC/hpf (ref 0–5)

## 2023-04-20 LAB — CBC
HCT: 39.5 % (ref 36.0–46.0)
Hemoglobin: 12.8 g/dL (ref 12.0–15.0)
MCH: 29 pg (ref 26.0–34.0)
MCHC: 32.4 g/dL (ref 30.0–36.0)
MCV: 89.4 fL (ref 80.0–100.0)
Platelets: 304 10*3/uL (ref 150–400)
RBC: 4.42 MIL/uL (ref 3.87–5.11)
RDW: 14.1 % (ref 11.5–15.5)
WBC: 4.8 10*3/uL (ref 4.0–10.5)
nRBC: 0 % (ref 0.0–0.2)

## 2023-04-20 LAB — SARS CORONAVIRUS 2 BY RT PCR: SARS Coronavirus 2 by RT PCR: NEGATIVE

## 2023-04-20 LAB — LIPASE, BLOOD: Lipase: 35 U/L (ref 11–51)

## 2023-04-20 MED ORDER — BENZONATATE 100 MG PO CAPS: 100.0000 mg | ORAL_CAPSULE | Freq: Three times a day (TID) | ORAL | 0 refills | Status: DC

## 2023-04-20 MED ORDER — IOHEXOL 300 MG/ML  SOLN
100.0000 mL | Freq: Once | INTRAMUSCULAR | Status: AC | PRN
  Administered 2023-04-20: 100 mL via INTRAVENOUS

## 2023-04-20 MED ORDER — BENZONATATE 100 MG PO CAPS
100.0000 mg | ORAL_CAPSULE | Freq: Once | ORAL | Status: AC
  Administered 2023-04-20: 100 mg via ORAL
  Filled 2023-04-20: qty 1

## 2023-04-20 NOTE — ED Triage Notes (Signed)
Patient has been coughing and dx with bronchitis. She has been using an inhaler which she stated is helping. She stated now when she coughs her ABD Pain.

## 2023-04-20 NOTE — ED Provider Notes (Signed)
EMERGENCY DEPARTMENT AT [facility] Provider Note   CSN: [id number] Arrival date & time: 04/20/23  1437     History  Chief Complaint  Patient presents with   Abdominal Pain   Cough    Cathy Estes is a 67 y.o. female with a past medical history significant for RA who presents to the ED due to left lower quadrant abdominal pain that started a few days ago.  Patient states she was recently diagnosed with bronchitis 1 week ago.  She noticed her left lower quadrant abdominal pain typically gets worse when coughing.  Denies nausea, vomiting, and diarrhea.  No urinary or vaginal symptoms.  Patient has not been sexually active in numerous years.  No concern for STIs.  History of abdominal hysterectomy.  No other abdominal operations.  No fever or chills.  Notes cough has been persistent over the past week.  She has been using her albuterol inhaler with no relief.  Denies chest pain and shortness of breath.  History obtained from patient and past medical records. No interpreter used during encounter.       Home Medications Prior to Admission medications   Medication Sig Start Date End Date Taking? Authorizing Provider  benzonatate (TESSALON) 100 MG capsule Take 1 capsule (100 mg total) by mouth every 8 (eight) hours. 04/20/23  Yes Joice Nazario C, PA-C  diclofenac Sodium (VOLTAREN) 1 % GEL Apply 2 g topically 4 (four) times daily. 03/13/20   Rolan Bucco, MD  Fluocinolone Acetonide Body (DERMA-SMOOTHE/FS BODY) 0.01 % OIL Apply 1 Application topically 2 (two) times daily as needed. 08/15/22   Verlee Monte, MD  hydrOXYzine (ATARAX) 25 MG tablet Take 1-2 tablets (25-50 mg total) by mouth every 6 (six) hours as needed for itching (May cause drowsiness). 02/01/23   Molpus, John, MD  methylPREDNISolone (MEDROL DOSEPAK) 4 MG TBPK tablet Take tapering dose per package instructions. 02/01/23   Molpus, John, MD  triamcinolone ointment (KENALOG) 0.1 % Apply topically twice daily  to BODY as needed for red, sandpaper like rash.  Do not use on face, groin or armpits. 08/15/22   Verlee Monte, MD      Allergies    Sulfa antibiotics    Review of Systems   Review of Systems  Constitutional:  Negative for chills and fever.  Respiratory:  Positive for cough. Negative for shortness of breath.   Cardiovascular:  Negative for chest pain.  Gastrointestinal:  Positive for abdominal pain. Negative for diarrhea, nausea and vomiting.    Physical Exam Updated Vital Signs BP 135/77   Pulse 73   Temp 98.7 F (37.1 C) (Oral)   Resp (!) 26   Ht 5\' 1"  (1.549 m)   Wt 66.2 kg   SpO2 98%   BMI 27.59 kg/m  Physical Exam Vitals and nursing note reviewed.  Constitutional:      General: She is not in acute distress.    Appearance: She is not ill-appearing.  HENT:     Head: Normocephalic.  Eyes:     Pupils: Pupils are equal, round, and reactive to light.  Cardiovascular:     Rate and Rhythm: Normal rate and regular rhythm.     Pulses: Normal pulses.     Heart sounds: Normal heart sounds. No murmur heard.    No friction rub. No gallop.  Pulmonary:     Effort: Pulmonary effort is normal.     Breath sounds: Normal breath sounds.  Abdominal:     General:  Abdomen is flat. There is no distension.     Palpations: Abdomen is soft.     Tenderness: There is abdominal tenderness. There is no guarding or rebound.     Comments: LLQ tenderness  Musculoskeletal:        General: Normal range of motion.     Cervical back: Neck supple.  Skin:    General: Skin is warm and dry.  Neurological:     General: No focal deficit present.     Mental Status: She is alert.  Psychiatric:        Mood and Affect: Mood normal.        Behavior: Behavior normal.     ED Results / Procedures / Treatments   Labs (all labs ordered are listed, but only abnormal results are displayed) Labs Reviewed  COMPREHENSIVE METABOLIC PANEL - Abnormal; Notable for the following components:      Result Value    Total Protein 8.9 (*)    Albumin 3.4 (*)    All other components within normal limits  URINALYSIS, ROUTINE W REFLEX MICROSCOPIC - Abnormal; Notable for the following components:   Leukocytes,Ua SMALL (*)    All other components within normal limits  URINALYSIS, MICROSCOPIC (REFLEX) - Abnormal; Notable for the following components:   Bacteria, UA MANY (*)    All other components within normal limits  SARS CORONAVIRUS 2 BY RT PCR  URINE CULTURE  LIPASE, BLOOD  CBC    EKG EKG Interpretation Date/Time:  Friday April 20 2023 16:22:05 EDT Ventricular Rate:  69 PR Interval:  153 QRS Duration:  73 QT Interval:  380 QTC Calculation: 408 R Axis:   7  Text Interpretation: Sinus rhythm Anterior infarct, old No previous ECGs available Confirmed by Elayne Snare ([id number]) on 04/20/2023 5:34:39 PM  Radiology No results found.  Procedures Procedures    Medications Ordered in ED Medications  iohexol (OMNIPAQUE) 300 MG/ML solution 100 mL (100 mLs Intravenous Contrast Given 04/20/23 1813)    ED Course/ Medical Decision Making/ A&P                             Medical Decision Making Amount and/or Complexity of Data Reviewed Labs: ordered. Decision-making details documented in ED Course. Radiology: ordered and independent interpretation performed. Decision-making details documented in ED Course. ECG/medicine tests: ordered and independent interpretation performed. Decision-making details documented in ED Course.  Risk Prescription drug management.   This patient presents to the ED for concern of LLQ abdominal pain, this involves an extensive number of treatment options, and is a complaint that carries with it a high risk of complications and morbidity.  The differential diagnosis includes diverticulitis, appendicitis, bowel obstruction, perforation, pelvic etiology, etc  67 year old female presents to the ED due to left lower quadrant abdominal pain that has been present for the  past few days.  Recently diagnosed with bronchitis 1 week ago.  Pain typically worse when coughing.  No nausea, vomiting, or diarrhea.  No urinary or vaginal symptoms.  History of abdominal hysterectomy however, no other abdominal operations.  Upon arrival, vitals all within normal limits.  Patient in no acute distress.  Physical exam significant for tenderness in left lower quadrant.  Voluntary guarding.  No rebound.  Routine labs ordered.  CT abdomen to rule out evidence of diverticulitis or other etiologies of pain.  Chest x-ray and COVID test due to persistent cough however, likely postviral cough.  CBC reassuring.  No leukocytosis.  Normal hemoglobin.  Lipase normal.  Low suspicion for pancreatitis.  COVID-negative.  CMP significant for elevated total protein 8.9.  Normal renal function.  No major electrolyte derangements.  UA significant for many bacteria and small leukocytes.  6-10 WBC. Appears contaminated. Patient denies any dysuria. Urine culture pending. EKG NSR. No signs of acute ischemia  Patient handed off to Arabella Merles, PA-C at shift change pending CT abdomen and CXR. If unremarkable, patient may be discharged home.         Final Clinical Impression(s) / ED Diagnoses Final diagnoses:  Left lower quadrant abdominal pain  Acute cough    Rx / DC Orders ED Discharge Orders          Ordered    benzonatate (TESSALON) 100 MG capsule  Every 8 hours        04/20/23 1829              Mannie Stabile, PA-C 04/20/23 1858    Rexford Maus, DO 04/20/23 2347

## 2023-04-20 NOTE — ED Provider Notes (Addendum)
  Physical Exam  BP 135/77   Pulse 71   Temp 98.7 F (37.1 C) (Oral)   Resp (!) 27   Ht 5\' 1"  (1.549 m)   Wt 66.2 kg   SpO2 99%   BMI 27.59 kg/m   Physical Exam Vitals and nursing note reviewed.  Constitutional:      General: She is not in acute distress. Pulmonary:     Effort: Pulmonary effort is normal.     Comments: Dry cough Abdominal:     General: Abdomen is flat.     Tenderness: There is abdominal tenderness in the left lower quadrant.  Neurological:     Mental Status: She is alert.     Procedures  Procedures  ED Course / MDM    Medical Decision Making Amount and/or Complexity of Data Reviewed Labs: ordered. Radiology: ordered.  Risk Prescription drug management.   68 y.o. female presents to the ED for concern of coughing and left lower quadrant abdominal pain  Differential diagnosis includes but is not limited to URI, pneumonia, bronchitis, diverticulitis, ovarian cyst, acute cystitis  ED Course:  Patient handed off to me by Claudette Stapler, PA-C.  Patient well-appearing on recheck with dry cough that has been ongoing for over 1 week.  Vital signs remained stable.  No concern for pneumonia, pleural effusion on chest x-ray.  No concerning findings on CT abdomen pelvis that would explain her left lower quadrant abdominal pain.  COVID test negative here today.  Her urinalysis did show small amount of leukocytes, however no nitrites, she is not having any dysuria.  Do not suspect acute cystitis at this time. EKG with normal sinus rhythm. Suspect the abdominal pain is secondary to her cough.  She has been prescribed Tessalon pearls to help with her cough. Since her pharmacy is currently closed, she has been given 1 dose of Tessalon here in the ER before discharge.   Impression: Dry cough  Disposition:  The patient was discharged home with instructions to take [facility] as needed for cough.  She was instructed to follow-up with her primary care provider  if her cough is not improved within the next week.   Return precautions given.  Lab Tests: I Ordered, and personally interpreted labs.  The pertinent results include:   CBC with no leukocytosis CMP, lipase with no concerning findings COVID-negative Urinalysis with small leukocytes but lots of squamous epithelial cells, suspect not a clean catch  Imaging Studies ordered: I ordered imaging studies including CT abdomen pelvis I independently visualized the imaging with scope of interpretation limited to determining acute life threatening conditions related to emergency care.  CT abdomen pelvis with no acute findings Chest x-ray with no consolidations or pleural effusions I agree with the radiologist interpretation   Cardiac Monitoring: / EKG: The patient was maintained on a cardiac monitor.  I personally viewed and interpreted the cardiac monitored which showed an underlying rhythm of:  NSR   Consultations Obtained: None   Co morbidities that complicate the patient evaluation  None  Social Determinants of Health:  Unknown             Arabella Merles, PA-C 04/20/23 2106    Arabella Merles, PA-C 04/20/23 2107    Rexford Maus, DO 04/20/23 2347

## 2023-04-20 NOTE — Discharge Instructions (Addendum)
You have been seen in the ER today for your cough and abdominal pain.  Your chest x-ray did not show any signs of pneumonia.  Your CT scan did not show any concerning findings today.  We suspect your abdominal pain is due to your coughing.  Your COVID test was negative today.  You have been prescribed Tessalon (benzonatate) you may take 1 capsule by mouth every 8 hours as needed for cough.  Follow-up with your PCP if your cough has not started to improve within the next week.  Return to the ER if you develop fever or chills, worsening abdominal pain, shortness of breath, or any other concerning symptoms for you.

## 2023-04-22 LAB — URINE CULTURE: Culture: <10000 — AB

## 2024-06-17 ENCOUNTER — Other Ambulatory Visit (HOSPITAL_COMMUNITY): Payer: Self-pay | Admitting: Family Medicine

## 2024-06-17 ENCOUNTER — Encounter (HOSPITAL_COMMUNITY): Payer: Self-pay

## 2024-06-17 DIAGNOSIS — R079 Chest pain, unspecified: Secondary | ICD-10-CM

## 2024-06-25 ENCOUNTER — Ambulatory Visit (HOSPITAL_COMMUNITY)

## 2024-07-01 ENCOUNTER — Telehealth (HOSPITAL_COMMUNITY): Payer: Self-pay | Admitting: *Deleted

## 2024-07-01 NOTE — Telephone Encounter (Signed)
 Reaching out to patient to offer assistance regarding upcoming cardiac imaging study; pt verbalizes understanding of appt date/time, parking situation and where to check in, pre-test NPO status and medications ordered, and verified current allergies; name and call back number provided for further questions should they arise Sid Seats RN Navigator Cardiac Imaging [facility] [phone] office [phone] cell

## 2024-07-02 ENCOUNTER — Other Ambulatory Visit: Payer: Self-pay | Admitting: Cardiology

## 2024-07-02 ENCOUNTER — Ambulatory Visit (HOSPITAL_BASED_OUTPATIENT_CLINIC_OR_DEPARTMENT_OTHER)
Admission: RE | Admit: 2024-07-02 | Discharge: 2024-07-02 | Disposition: A | Discharge status: Home / Self Care | Source: Ambulatory Visit | Attending: Cardiology | Admitting: Cardiology

## 2024-07-02 ENCOUNTER — Ambulatory Visit (HOSPITAL_COMMUNITY)
Admission: RE | Admit: 2024-07-02 | Discharge: 2024-07-02 | Disposition: A | Discharge status: Home / Self Care | Source: Ambulatory Visit | Attending: Family Medicine | Admitting: Family Medicine

## 2024-07-02 DIAGNOSIS — I2510 Atherosclerotic heart disease of native coronary artery without angina pectoris: Secondary | ICD-10-CM

## 2024-07-02 DIAGNOSIS — R931 Abnormal findings on diagnostic imaging of heart and coronary circulation: Secondary | ICD-10-CM

## 2024-07-02 DIAGNOSIS — I700 Atherosclerosis of aorta: Secondary | ICD-10-CM | POA: Diagnosis not present

## 2024-07-02 DIAGNOSIS — R079 Chest pain, unspecified: Secondary | ICD-10-CM

## 2024-07-02 MED ORDER — IOHEXOL 350 MG/ML SOLN
100.0000 mL | Freq: Once | INTRAVENOUS | Status: AC | PRN
  Administered 2024-07-02: 100 mL via INTRAVENOUS

## 2024-07-02 MED ORDER — NITROGLYCERIN 0.4 MG SL SUBL
0.8000 mg | SUBLINGUAL_TABLET | Freq: Once | SUBLINGUAL | Status: AC
  Administered 2024-07-02: 0.8 mg via SUBLINGUAL

## 2024-07-02 NOTE — Telephone Encounter (Signed)
 Notified of critical CT results by Dr. Michele.   Results sent to Dr. Rufino office and Dr. Licia made aware with acknowledgement.     Attempted to call patient x2 to advise if she has any symptoms, please proceed to ER per MD, unable to reach, LMTCB.

## 2024-07-02 NOTE — Telephone Encounter (Signed)
 Patient returned call-aware if she has symptoms please proceed to ER per MD.  Aware to follow up with Dr. Licia.

## 2024-07-04 ENCOUNTER — Ambulatory Visit: Payer: Self-pay | Admitting: Cardiology

## 2024-07-14 ENCOUNTER — Ambulatory Visit: Attending: Cardiovascular Disease | Admitting: Cardiovascular Disease

## 2024-07-14 VITALS — BP 122/74 | HR 94 | Ht 61.0 in | Wt 152.4 lb

## 2024-07-14 DIAGNOSIS — I2510 Atherosclerotic heart disease of native coronary artery without angina pectoris: Secondary | ICD-10-CM | POA: Insufficient documentation

## 2024-07-14 DIAGNOSIS — R931 Abnormal findings on diagnostic imaging of heart and coronary circulation: Secondary | ICD-10-CM

## 2024-07-14 DIAGNOSIS — E782 Mixed hyperlipidemia: Secondary | ICD-10-CM | POA: Diagnosis not present

## 2024-07-14 DIAGNOSIS — E785 Hyperlipidemia, unspecified: Secondary | ICD-10-CM | POA: Insufficient documentation

## 2024-07-14 NOTE — Patient Instructions (Signed)
 Medication Instructions:  Your physician recommends that you continue on your current medications as directed. Please refer to the Current Medication list given to you today.  *If you need a refill on your cardiac medications before your next appointment, please call your pharmacy*  Lab Work: Your physician recommends that you have labs drawn today: BMET & CBC  If you have labs (blood work) drawn today and your tests are completely normal, you will receive your results only by: MyChart Message (if you have MyChart) OR A paper copy in the mail If you have any lab test that is abnormal or we need to change your treatment, we will call you to review the results.  Testing/Procedures: See below  Follow-Up: At [facility], you and your health needs are our priority.  As part of our continuing mission to provide you with exceptional heart care, our providers are all part of one team.  This team includes your primary Cardiologist (physician) and Advanced Practice Providers or APPs (Physician Assistants and Nurse Practitioners) who all work together to provide you with the care you need, when you need it.  Your next appointment:   2-3 week(s) after heart cath (10/14)  Provider:   Dorn Lesches, MD    We recommend signing up for the patient portal called MyChart.  Sign up information is provided on this After Visit Summary.  MyChart is used to connect with patients for Virtual Visits (Telemedicine).  Patients are able to view lab/test results, encounter notes, upcoming appointments, etc.  Non-urgent messages can be sent to your provider as well.   To learn more about what you can do with MyChart, go to [email or web address].   Other Instructions       Cardiac/Peripheral Catheterization   You are scheduled for a Cardiac Catheterization on Tuesday, October 14 with Dr. Alm Clay.  1. Please arrive at the [facility] (Main Entrance A) at [facility]: [address] [place], [place] [address] at 8:30 AM (This time is 2 hour(s) before your procedure to ensure your preparation).   Free valet parking service is available. You will check in at ADMITTING. The support person will be asked to wait in the waiting room.  It is OK to have someone drop you off and come back when you are ready to be discharged.        Special note: Every effort is made to have your procedure done on time. Please understand that emergencies sometimes delay scheduled procedures.  2. Diet: Nothing to eat after midnight.  3. Hydration:You need to be well hydrated before your procedure. On October 14, you may drink approved liquids (see below) until 2 hours before the procedure, with 16 oz of water as your last intake.   List of approved liquids water, clear juice, clear tea, black coffee, fruit juices, non-citric and without pulp, carbonated beverages, Gatorade, Kool -Aid, plain Jello-O and plain ice popsicles.  4. Labs: You will need to have blood drawn today (10/6). 5. Medication instructions in preparation for your procedure:    On the morning of your procedure, take Aspirin 81 mg and any morning medicines NOT listed above.  You may use sips of water.  6. Plan to go home the same day, you will only stay overnight if medically necessary. 7. You MUST have a responsible adult to drive you home. 8. An adult MUST be with you the first 24 hours after you arrive home. 9. Bring a current list of your medications,  and the last time and date medication taken. 10. Bring ID and current insurance cards. 11.Please wear clothes that are easy to get on and off and wear slip-on shoes.  Thank you for allowing us  to care for you!   -- [facility]

## 2024-07-14 NOTE — Addendum Note (Signed)
 Addended by: LORRENE FEDERICO CROME on: 07/14/2024 02:31 PM   Modules accepted: Orders

## 2024-07-14 NOTE — Assessment & Plan Note (Signed)
History of hyperlipidemia on statin therapy followed by her PCP. 

## 2024-07-14 NOTE — Assessment & Plan Note (Signed)
 Patient was referred by Dr. Licia for cardiac catheterization because of a abnormal perfusion scan performed 12/13/2023 showing a reversible ischemic abnormality in the circumflex and/or RCA territories.  More recently she had a coronary CTA performed 9//25 that showed disease in all 3 vessels.  She is however asymptomatic.  I have reviewed the risks, indications, and alternatives to cardiac catheterization, possible angioplasty, and stenting with the patient. Risks include but are not limited to bleeding, infection, vascular injury, stroke, myocardial infection, arrhythmia, kidney injury, radiation-related injury in the case of prolonged fluoroscopy use, emergency cardiac surgery, and death. The patient understands the risks of serious complication is 1-2 in 1000 with diagnostic cardiac cath and 1-2% or less with angioplasty/stenting.

## 2024-07-14 NOTE — Progress Notes (Signed)
 07/14/2024 Cristopher Silvan   06/26/1956  [id number]  Primary Physician Waddell Palma, PA-C Primary Cardiologist: Dorn [name], [name]:  Cathy Estes is a 68 y.o. mildly overweight widowed African-American female mother of 2 daughters, grandmother of 5 grandchildren who is accompanied by one of her daughters Baruch today.  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  She was referred by Dr. Licia, her cardiologist, for left heart cath plus or minus intervention.  Her cardiovascular risk factors include treated hyperlipidemia.  Her sister apparently did have a myocardial infarction.  She has never had a heart attack or stroke.  She denies chest pain or shortness of breath.  She had a Myoview stress test performed 12/09/2023 that was abnormal and notable for ischemia in the RCA posterior by circumflex territories.  She more recently had a CTA/FFR 06/28/2024 revealing three-vessel disease.   Current Meds  Medication Sig   lovastatin (MEVACOR) 20 MG tablet Take 20 mg by mouth in the morning and at bedtime.   predniSONE  (DELTASONE ) 5 MG tablet Take 5 mg by mouth daily.     Allergies  Allergen Reactions   Sulfa Antibiotics Rash    Social History   Socioeconomic History   Marital status: Single    Spouse name: Not on file   Number of children: Not on file   Years of education: Not on file   Highest education level: Not on file  Occupational History   Not on file  Tobacco Use   Smoking status: Never   Smokeless tobacco: Never  Vaping Use   Vaping status: Never Used  Substance and Sexual Activity   Alcohol use: No   Drug use: No   Sexual activity: Yes    Birth control/protection: Surgical  Other Topics Concern   Not on file  Social History Narrative   Not on file   Social Drivers of Health   Financial Resource Strain: Not on file  Food Insecurity: Not on file  Transportation Needs: Not on file  Physical Activity: Not on file   Stress: Not on file  Social Connections: Unknown (02/19/2022)   Received from [facility]   Social Network    Social Network: Not on file  Intimate Partner Violence: Unknown (01/11/2022)   Received from [facility]   HITS    Physically Hurt: Not on file    Insult or Talk Down To: Not on file    Threaten Physical Harm: Not on file    Scream or Curse: Not on file     Review of Systems: General: negative for chills, fever, night sweats or weight changes.  Cardiovascular: negative for chest pain, dyspnea on exertion, edema, orthopnea, palpitations, paroxysmal nocturnal dyspnea or shortness of breath Dermatological: negative for rash Respiratory: negative for cough or wheezing Urologic: negative for hematuria Abdominal: negative for nausea, vomiting, diarrhea, bright red blood per rectum, melena, or hematemesis Neurologic: negative for visual changes, syncope, or dizziness All other systems reviewed and are otherwise negative except as noted above.    Blood pressure 122/74, pulse 94, height 5' 1 (1.549 m), weight 152 lb 6.4 oz (69.1 kg), SpO2 99%.  General appearance: alert and no distress Neck: no adenopathy, no carotid bruit, no JVD, supple, symmetrical, trachea midline, and thyroid not enlarged, symmetric, no tenderness/mass/nodules Lungs: clear to auscultation bilaterally Heart: regular rate and rhythm, S1, S2 normal, no murmur, click, rub or gallop Extremities: extremities normal, atraumatic, no cyanosis or edema  Pulses: 2+ and symmetric Skin: Skin color, texture, turgor normal. No rashes or lesions Neurologic: Grossly normal  EKG EKG Interpretation Date/Time:  Monday July 14 2024 13:45:44 EDT Ventricular Rate:  94 PR Interval:  148 QRS Duration:  62 QT Interval:  342 QTC Calculation: 427 R Axis:   -1  Text Interpretation: Sinus rhythm with Premature atrial complexes Possible Left atrial enlargement Septal infarct , age undetermined When compared with ECG of  20-Apr-2023 16:22, PREVIOUS ECG IS PRESENT Confirmed by Court Carrier [phone]) on 07/14/2024 2:06:26 PM    ASSESSMENT AND PLAN:   Hyperlipidemia History of hyperlipidemia on statin therapy followed by her PCP.  Coronary artery disease Patient was referred by Dr. Licia for cardiac catheterization because of a abnormal perfusion scan performed 12/13/2023 showing a reversible ischemic abnormality in the circumflex and/or RCA territories.  More recently she had a coronary CTA performed 9//25 that showed disease in all 3 vessels.  She is however asymptomatic.  I have reviewed the risks, indications, and alternatives to cardiac catheterization, possible angioplasty, and stenting with the patient. Risks include but are not limited to bleeding, infection, vascular injury, stroke, myocardial infection, arrhythmia, kidney injury, radiation-related injury in the case of prolonged fluoroscopy use, emergency cardiac surgery, and death. The patient understands the risks of serious complication is 1-2 in 1000 with diagnostic cardiac cath and 1-2% or less with angioplasty/stenting.       Carrier DOROTHA Court MD FACP,FACC,FAHA, [facility] 07/14/2024 2:19 PM

## 2024-07-14 NOTE — H&P (View-Only) (Signed)
 07/14/2024 Cathy Estes   06/26/1956  [id number]  Primary Physician Waddell Palma, PA-C Primary Cardiologist: Dorn Cathy Lesches MD [place], [name], [name]:  Cathy Estes is a 68 y.o. mildly overweight widowed African-American female mother of 2 daughters, grandmother of 5 grandchildren who is accompanied by one of her daughters Baruch today.  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  She was referred by Dr. Licia, her cardiologist, for left heart cath plus or minus intervention.  Her cardiovascular risk factors include treated hyperlipidemia.  Her sister apparently did have a myocardial infarction.  She has never had a heart attack or stroke.  She denies chest pain or shortness of breath.  She had a Myoview stress test performed 12/09/2023 that was abnormal and notable for ischemia in the RCA posterior by circumflex territories.  She more recently had a CTA/FFR 06/28/2024 revealing three-vessel disease.   Current Meds  Medication Sig   lovastatin (MEVACOR) 20 MG tablet Take 20 mg by mouth in the morning and at bedtime.   predniSONE  (DELTASONE ) 5 MG tablet Take 5 mg by mouth daily.     Allergies  Allergen Reactions   Sulfa Antibiotics Rash    Social History   Socioeconomic History   Marital status: Single    Spouse name: Not on file   Number of children: Not on file   Years of education: Not on file   Highest education level: Not on file  Occupational History   Not on file  Tobacco Use   Smoking status: Never   Smokeless tobacco: Never  Vaping Use   Vaping status: Never Used  Substance and Sexual Activity   Alcohol use: No   Drug use: No   Sexual activity: Yes    Birth control/protection: Surgical  Other Topics Concern   Not on file  Social History Narrative   Not on file   Social Drivers of Health   Financial Resource Strain: Not on file  Food Insecurity: Not on file  Transportation Needs: Not on file  Physical Activity: Not on file   Stress: Not on file  Social Connections: Unknown (02/19/2022)   Received from [facility]   Social Network    Social Network: Not on file  Intimate Partner Violence: Unknown (01/11/2022)   Received from [facility]   HITS    Physically Hurt: Not on file    Insult or Talk Down To: Not on file    Threaten Physical Harm: Not on file    Scream or Curse: Not on file     Review of Systems: General: negative for chills, fever, night sweats or weight changes.  Cardiovascular: negative for chest pain, dyspnea on exertion, edema, orthopnea, palpitations, paroxysmal nocturnal dyspnea or shortness of breath Dermatological: negative for rash Respiratory: negative for cough or wheezing Urologic: negative for hematuria Abdominal: negative for nausea, vomiting, diarrhea, bright red blood per rectum, melena, or hematemesis Neurologic: negative for visual changes, syncope, or dizziness All other systems reviewed and are otherwise negative except as noted above.    Blood pressure 122/74, pulse 94, height 5' 1 (1.549 m), weight 152 lb 6.4 oz (69.1 kg), SpO2 99%.  General appearance: alert and no distress Neck: no adenopathy, no carotid bruit, no JVD, supple, symmetrical, trachea midline, and thyroid not enlarged, symmetric, no tenderness/mass/nodules Lungs: clear to auscultation bilaterally Heart: regular rate and rhythm, S1, S2 normal, no murmur, click, rub or gallop Extremities: extremities normal, atraumatic, no cyanosis or edema  Pulses: 2+ and symmetric Skin: Skin color, texture, turgor normal. No rashes or lesions Neurologic: Grossly normal  EKG EKG Interpretation Date/Time:  Monday July 14 2024 13:45:44 EDT Ventricular Rate:  94 PR Interval:  148 QRS Duration:  62 QT Interval:  342 QTC Calculation: 427 R Axis:   -1  Text Interpretation: Sinus rhythm with Premature atrial complexes Possible Left atrial enlargement Septal infarct , age undetermined When compared with ECG of  20-Apr-2023 16:22, PREVIOUS ECG IS PRESENT Confirmed by Estes Carrier [phone]) on 07/14/2024 2:06:26 PM    ASSESSMENT AND PLAN:   Hyperlipidemia History of hyperlipidemia on statin therapy followed by her PCP.  Coronary artery disease Patient was referred by Dr. Licia for cardiac catheterization because of a abnormal perfusion scan performed 12/13/2023 showing a reversible ischemic abnormality in the circumflex and/or RCA territories.  More recently she had a coronary CTA performed 9//25 that showed disease in all 3 vessels.  She is however asymptomatic.  I have reviewed the risks, indications, and alternatives to cardiac catheterization, possible angioplasty, and stenting with the patient. Risks include but are not limited to bleeding, infection, vascular injury, stroke, myocardial infection, arrhythmia, kidney injury, radiation-related injury in the case of prolonged fluoroscopy use, emergency cardiac surgery, and death. The patient understands the risks of serious complication is 1-2 in 1000 with diagnostic cardiac cath and 1-2% or less with angioplasty/stenting.       Carrier Cathy Court MD FACP,FACC,FAHA, [facility] 07/14/2024 2:19 PM

## 2024-07-15 ENCOUNTER — Ambulatory Visit: Payer: Self-pay | Admitting: Cardiovascular Disease

## 2024-07-15 LAB — BASIC METABOLIC PANEL WITH GFR
BUN/Creatinine Ratio: 14 (ref 12–28)
BUN: 12 mg/dL (ref 8–27)
CO2: 22 mmol/L (ref 20–29)
Calcium: 9.4 mg/dL (ref 8.7–10.3)
Chloride: 97 mmol/L (ref 96–106)
Creatinine, Ser: 0.84 mg/dL (ref 0.57–1.00)
Glucose: 75 mg/dL (ref 70–99)
Potassium: 3.9 mmol/L (ref 3.5–5.2)
Sodium: 134 mmol/L (ref 134–144)
eGFR: 77 mL/min/1.73 (ref >59)

## 2024-07-15 LAB — CBC WITH DIFFERENTIAL/PLATELET
Basophils Absolute: 0.1 x10E3/uL (ref 0.0–0.2)
Basos: 1 %
EOS (ABSOLUTE): 0 x10E3/uL (ref 0.0–0.4)
Eos: 0 %
Hematocrit: 42.7 % (ref 34.0–46.6)
Hemoglobin: 13.5 g/dL (ref 11.1–15.9)
Immature Grans (Abs): 0 x10E3/uL (ref 0.0–0.1)
Immature Granulocytes: 0 %
Lymphocytes Absolute: 2 x10E3/uL (ref 0.7–3.1)
Lymphs: 50 %
MCH: 28.7 pg (ref 26.6–33.0)
MCHC: 31.6 g/dL (ref 31.5–35.7)
MCV: 91 fL (ref 79–97)
Monocytes Absolute: 0.5 x10E3/uL (ref 0.1–0.9)
Monocytes: 12 %
Neutrophils Absolute: 1.5 x10E3/uL (ref 1.4–7.0)
Neutrophils: 37 %
Platelets: 242 x10E3/uL (ref 150–450)
RBC: 4.7 x10E6/uL (ref 3.77–5.28)
RDW: 13.1 % (ref 11.7–15.4)
WBC: 4 x10E3/uL (ref 3.4–10.8)

## 2024-07-21 ENCOUNTER — Telehealth: Payer: Self-pay | Admitting: *Deleted

## 2024-07-21 NOTE — Telephone Encounter (Signed)
 Cardiac Catheterization scheduled at [facility] for: Tuesday July 22, 2024 10:30 AM Arrival time [facility] Main Entrance A at: 8:30 AM  Diet: -Nothing to eat after midnight.  Hydration: -May drink clear liquids until 2 hours before the procedure.  Approved liquids: Water, clear tea, black coffee, fruit juices-non-citric and without pulp,Gatorade, plain Jello/popsicles.   -Please drink 16 oz of water 2 hours before procedure.  Medication instructions: -Usual morning medications can be taken including aspirin 81 mg.  Plan to go home the same day, you will only stay overnight if medically necessary.  You must have responsible adult to drive you home.  Someone must be with you the first 24 hours after you arrive home.  Reviewed procedure instructions with patient.

## 2024-07-22 ENCOUNTER — Other Ambulatory Visit: Payer: Self-pay

## 2024-07-22 ENCOUNTER — Ambulatory Visit (HOSPITAL_COMMUNITY)
Admission: RE | Admit: 2024-07-22 | Discharge: 2024-07-22 | Disposition: A | Discharge status: Home / Self Care | Attending: Cardiology | Admitting: Cardiology

## 2024-07-22 ENCOUNTER — Encounter (HOSPITAL_COMMUNITY)
Admission: RE | Disposition: A | Discharge status: Home / Self Care | Payer: Self-pay | Source: Home / Self Care | Attending: Cardiology

## 2024-07-22 DIAGNOSIS — I2510 Atherosclerotic heart disease of native coronary artery without angina pectoris: Secondary | ICD-10-CM

## 2024-07-22 DIAGNOSIS — Z79899 Other long term (current) drug therapy: Secondary | ICD-10-CM | POA: Diagnosis not present

## 2024-07-22 DIAGNOSIS — E785 Hyperlipidemia, unspecified: Secondary | ICD-10-CM | POA: Insufficient documentation

## 2024-07-22 DIAGNOSIS — Z8249 Family history of ischemic heart disease and other diseases of the circulatory system: Secondary | ICD-10-CM | POA: Diagnosis not present

## 2024-07-22 DIAGNOSIS — R931 Abnormal findings on diagnostic imaging of heart and coronary circulation: Secondary | ICD-10-CM | POA: Insufficient documentation

## 2024-07-22 DIAGNOSIS — I2582 Chronic total occlusion of coronary artery: Secondary | ICD-10-CM | POA: Diagnosis not present

## 2024-07-22 HISTORY — PX: LEFT HEART CATH AND CORONARY ANGIOGRAPHY: CATH118249

## 2024-07-22 SURGERY — LEFT HEART CATH AND CORONARY ANGIOGRAPHY
Anesthesia: LOCAL

## 2024-07-22 MED ORDER — VERAPAMIL HCL 2.5 MG/ML IV SOLN
INTRAVENOUS | Status: DC | PRN
  Administered 2024-07-22: 10 mL via INTRA_ARTERIAL

## 2024-07-22 MED ORDER — HEPARIN SODIUM (PORCINE) 1000 UNIT/ML IJ SOLN
INTRAMUSCULAR | Status: AC
  Filled 2024-07-22: qty 10

## 2024-07-22 MED ORDER — LIDOCAINE HCL (PF) 1 % IJ SOLN
INTRAMUSCULAR | Status: AC
  Filled 2024-07-22: qty 30

## 2024-07-22 MED ORDER — LABETALOL HCL 5 MG/ML IV SOLN: 10.0000 mg | INTRAVENOUS | Status: DC | PRN

## 2024-07-22 MED ORDER — SODIUM CHLORIDE 0.9% FLUSH: 3.0000 mL | Freq: Two times a day (BID) | INTRAVENOUS | Status: DC

## 2024-07-22 MED ORDER — HYDRALAZINE HCL 20 MG/ML IJ SOLN: 10.0000 mg | INTRAMUSCULAR | Status: DC | PRN

## 2024-07-22 MED ORDER — ACETAMINOPHEN 325 MG PO TABS: 650.0000 mg | ORAL_TABLET | ORAL | Status: DC | PRN

## 2024-07-22 MED ORDER — FENTANYL CITRATE (PF) 100 MCG/2ML IJ SOLN
INTRAMUSCULAR | Status: DC | PRN
  Administered 2024-07-22: 25 ug via INTRAVENOUS

## 2024-07-22 MED ORDER — FREE WATER: 250.0000 mL | Freq: Once | Status: DC

## 2024-07-22 MED ORDER — VERAPAMIL HCL 2.5 MG/ML IV SOLN
INTRAVENOUS | Status: AC
  Filled 2024-07-22: qty 2

## 2024-07-22 MED ORDER — SODIUM CHLORIDE 0.9% FLUSH: 3.0000 mL | INTRAVENOUS | Status: DC | PRN

## 2024-07-22 MED ORDER — SODIUM CHLORIDE 0.9 % IV SOLN: 250.0000 mL | INTRAVENOUS | Status: DC | PRN

## 2024-07-22 MED ORDER — ASPIRIN 81 MG PO CHEW: 81.0000 mg | CHEWABLE_TABLET | ORAL | Status: DC

## 2024-07-22 MED ORDER — IOHEXOL 350 MG/ML SOLN
INTRAVENOUS | Status: DC | PRN
  Administered 2024-07-22: 50 mL

## 2024-07-22 MED ORDER — HEPARIN (PORCINE) IN NACL 1000-0.9 UT/500ML-% IV SOLN
INTRAVENOUS | Status: DC | PRN
  Administered 2024-07-22 (×2): 500 mL

## 2024-07-22 MED ORDER — FENTANYL CITRATE (PF) 100 MCG/2ML IJ SOLN
INTRAMUSCULAR | Status: AC
  Filled 2024-07-22: qty 2

## 2024-07-22 MED ORDER — MIDAZOLAM HCL 2 MG/2ML IJ SOLN
INTRAMUSCULAR | Status: DC | PRN
  Administered 2024-07-22: 1 mg via INTRAVENOUS

## 2024-07-22 MED ORDER — ONDANSETRON HCL 4 MG/2ML IJ SOLN: 4.0000 mg | Freq: Four times a day (QID) | INTRAMUSCULAR | Status: DC | PRN

## 2024-07-22 MED ORDER — LIDOCAINE HCL (PF) 1 % IJ SOLN
INTRAMUSCULAR | Status: DC | PRN
  Administered 2024-07-22: 2 mL

## 2024-07-22 MED ORDER — MIDAZOLAM HCL 2 MG/2ML IJ SOLN
INTRAMUSCULAR | Status: AC
Start: 2024-07-22 — End: 2024-07-22
  Filled 2024-07-22: qty 2

## 2024-07-22 MED ORDER — FREE WATER
500.0000 mL | Freq: Once | Status: AC
  Administered 2024-07-22: 500 mL via ORAL

## 2024-07-22 SURGICAL SUPPLY — 7 items
CATH 5FR JL3.5 JR4 ANG PIG MP (CATHETERS) IMPLANT
DEVICE RAD COMP TR BAND LRG (VASCULAR PRODUCTS) IMPLANT
GLIDESHEATH SLEND SS 6F .021 (SHEATH) IMPLANT
GUIDEWIRE INQWIRE 1.5J.035X260 (WIRE) IMPLANT
PACK CARDIAC CATHETERIZATION (CUSTOM PROCEDURE TRAY) ×1 IMPLANT
SET ATX-X65L (MISCELLANEOUS) IMPLANT
SHEATH PROBE COVER 6X72 (BAG) IMPLANT

## 2024-07-22 NOTE — Interval H&P Note (Signed)
 History and Physical Interval Note:  07/22/2024 10:57 AM  Cathy Estes  has presented today for surgery, with the diagnosis of abnormal cta - INDICATING 3 Vessel CAD.  The various methods of treatment have been discussed with the patient and family. After consideration of risks, benefits and other options for treatment, the patient has consented to  Procedure(s): LEFT HEART CATH AND CORONARY ANGIOGRAPHY (N/A) PERCUTANEOUS CORONARY INTERVENTION    as a surgical intervention.  The patient's history has been reviewed, patient examined, no change in status, stable for surgery.  I have reviewed the patient's chart and labs.  Questions were answered to the patient's satisfaction.     Cath Lab Visit (complete for each Cath Lab visit)  Clinical Evaluation Leading to the Procedure:   ACS: No.  Non-ACS:    Anginal Classification: No Symptoms  Anti-ischemic medical therapy: No Therapy  Non-Invasive Test Results: High-risk stress test findings: cardiac mortality >3%/year  Prior CABG: No previous CABG     Alm Clay

## 2024-07-23 ENCOUNTER — Encounter (HOSPITAL_COMMUNITY): Payer: Self-pay | Admitting: Cardiology

## 2024-08-13 ENCOUNTER — Encounter: Payer: Self-pay | Admitting: Cardiovascular Disease

## 2024-08-13 ENCOUNTER — Ambulatory Visit: Attending: Cardiology | Admitting: Cardiovascular Disease

## 2024-08-13 VITALS — BP 138/80 | HR 84 | Ht 61.0 in | Wt 156.0 lb

## 2024-08-13 DIAGNOSIS — I2510 Atherosclerotic heart disease of native coronary artery without angina pectoris: Secondary | ICD-10-CM

## 2024-08-13 MED ORDER — METOPROLOL SUCCINATE ER 50 MG PO TB24: 50.0000 mg | ORAL_TABLET | Freq: Every day | ORAL | 3 refills | Status: AC

## 2024-08-13 NOTE — Progress Notes (Signed)
 Ms. Cathy Estes returns today for follow-up of her recent outpatient diagnostic cath performed by Dr. Anner 07/22/2024.  She was initially referred by Dr. Elene because of an abnormal stress test 12/13/2023 as well as an abnormal coronary CTA 07/10/2024 with a coronary calcium score 1676 with FFR analysis suggesting three-vessel disease.  Her cath performed by Dr. Anner revealed an occluded dominant RCA with bridging collaterals as well as an occluded nondominant circumflex with a widely patent LAD.  She is completely asymptomatic and coronary risk factor modification including aggressive treatment of her lipid profile was recommended.  Her right radial puncture site is healed.  She continues to deny symptoms.  Her primary cardiologist, Dr. Licia, will address her risk factors.  I will see her back as needed.  [name], M.D., FACP, [facility], FAHA, [facility]  [address] [place], [place]  [address]  [phone] 08/13/2024 10:43 AM

## 2024-08-13 NOTE — Patient Instructions (Signed)

## 2024-09-29 ENCOUNTER — Telehealth: Payer: Self-pay | Admitting: Cardiovascular Disease

## 2024-09-29 NOTE — Telephone Encounter (Signed)
 Pt requesting documentation from heart cath procedure, regarding anesthesia that was given prior to dental procedure. Pt stating she is having a crown placed and dental office won't do it until they receive paperwork from Cardiology. Will reach out to Dr. Court and Dr. Anner for advisement. Pt verbalized understanding of plan.

## 2024-09-29 NOTE — Telephone Encounter (Signed)
 Patient states that its important she speaks with Dr. Ranee nurse, did not want to discuss with me. Please advise.

## 2024-09-29 NOTE — Telephone Encounter (Signed)
 Pre-op team, please obtain official request from dental office.

## 2024-09-29 NOTE — Telephone Encounter (Signed)
"   Spoke to patient   Informed patient to have  the Dental office to fax the pre-op clearance  for her dental work -- ( anesthesia)  RN informed patient of the process of obtaining a pre - op clearance for anesthesia use  RN gave patient  the fax [phone].    Patient voice understanding , she states she will contact the Dental office "

## 2024-10-03 NOTE — Telephone Encounter (Signed)
 Pt voiced understanding

## 2024-10-06 NOTE — Telephone Encounter (Signed)
 As of this moment today 10/06/24 our office still has not received the request from the DDS. I will reach out to the pt as FYI.
